# Patient Record
Sex: Female | Born: 1965 | Race: White | Hispanic: No | Marital: Married | Smoking: Former smoker
Health system: Southern US, Community
[De-identification: ages and names within clinical notes are randomized; demographics above are authoritative.]

## PROBLEM LIST (undated history)

## (undated) DIAGNOSIS — I82409 Acute embolism and thrombosis of unspecified deep veins of unspecified lower extremity: Secondary | ICD-10-CM

## (undated) HISTORY — DX: Acute embolism and thrombosis of unspecified deep veins of unspecified lower extremity: I82.409

---

## 1998-09-05 ENCOUNTER — Other Ambulatory Visit: Admission: RE | Admit: 1998-09-05 | Discharge: 1998-09-05 | Payer: Self-pay | Admitting: *Deleted

## 2000-05-10 ENCOUNTER — Other Ambulatory Visit: Admission: RE | Admit: 2000-05-10 | Discharge: 2000-05-10 | Payer: Self-pay | Admitting: *Deleted

## 2002-04-10 ENCOUNTER — Other Ambulatory Visit: Admission: RE | Admit: 2002-04-10 | Discharge: 2002-04-10 | Payer: Self-pay | Admitting: *Deleted

## 2002-07-19 HISTORY — PX: KNEE ARTHROSCOPY: SHX127

## 2003-05-23 ENCOUNTER — Other Ambulatory Visit: Admission: RE | Admit: 2003-05-23 | Discharge: 2003-05-23 | Payer: Self-pay | Admitting: *Deleted

## 2004-05-29 ENCOUNTER — Ambulatory Visit: Payer: Self-pay | Admitting: Internal Medicine

## 2004-07-28 ENCOUNTER — Ambulatory Visit: Payer: Self-pay | Admitting: Internal Medicine

## 2004-09-08 ENCOUNTER — Ambulatory Visit: Payer: Self-pay | Admitting: Internal Medicine

## 2004-10-07 ENCOUNTER — Ambulatory Visit: Payer: Self-pay | Admitting: Internal Medicine

## 2004-11-05 ENCOUNTER — Ambulatory Visit (HOSPITAL_COMMUNITY): Admission: RE | Admit: 2004-11-05 | Discharge: 2004-11-05 | Payer: Self-pay | Admitting: *Deleted

## 2005-01-11 ENCOUNTER — Ambulatory Visit (HOSPITAL_COMMUNITY): Admission: RE | Admit: 2005-01-11 | Discharge: 2005-01-11 | Payer: Self-pay | Admitting: Orthopedic Surgery

## 2005-01-12 ENCOUNTER — Ambulatory Visit: Payer: Self-pay | Admitting: Internal Medicine

## 2005-01-14 ENCOUNTER — Ambulatory Visit: Payer: Self-pay | Admitting: Internal Medicine

## 2005-01-15 ENCOUNTER — Ambulatory Visit: Payer: Self-pay | Admitting: Internal Medicine

## 2005-01-18 ENCOUNTER — Ambulatory Visit: Payer: Self-pay | Admitting: Internal Medicine

## 2005-01-20 ENCOUNTER — Ambulatory Visit: Payer: Self-pay | Admitting: Internal Medicine

## 2005-01-22 ENCOUNTER — Ambulatory Visit: Payer: Self-pay | Admitting: Internal Medicine

## 2005-01-25 ENCOUNTER — Ambulatory Visit: Payer: Self-pay | Admitting: Internal Medicine

## 2005-01-26 ENCOUNTER — Ambulatory Visit (HOSPITAL_COMMUNITY): Admission: RE | Admit: 2005-01-26 | Discharge: 2005-01-26 | Payer: Self-pay | Admitting: Neurosurgery

## 2005-02-02 ENCOUNTER — Ambulatory Visit: Payer: Self-pay | Admitting: Internal Medicine

## 2005-02-11 ENCOUNTER — Encounter: Admission: RE | Admit: 2005-02-11 | Discharge: 2005-02-23 | Payer: Self-pay | Admitting: Orthopedic Surgery

## 2005-02-15 ENCOUNTER — Ambulatory Visit: Payer: Self-pay | Admitting: Internal Medicine

## 2005-02-23 ENCOUNTER — Ambulatory Visit (HOSPITAL_COMMUNITY): Admission: RE | Admit: 2005-02-23 | Discharge: 2005-02-23 | Payer: Self-pay | Admitting: *Deleted

## 2005-02-25 ENCOUNTER — Ambulatory Visit: Payer: Self-pay | Admitting: Internal Medicine

## 2005-03-18 ENCOUNTER — Ambulatory Visit: Payer: Self-pay | Admitting: Internal Medicine

## 2005-03-25 ENCOUNTER — Ambulatory Visit: Payer: Self-pay | Admitting: Internal Medicine

## 2005-05-06 ENCOUNTER — Ambulatory Visit: Payer: Self-pay | Admitting: Internal Medicine

## 2005-06-30 ENCOUNTER — Ambulatory Visit: Payer: Self-pay | Admitting: Internal Medicine

## 2005-07-21 ENCOUNTER — Ambulatory Visit: Payer: Self-pay | Admitting: Internal Medicine

## 2005-08-04 ENCOUNTER — Ambulatory Visit: Payer: Self-pay | Admitting: Family Medicine

## 2005-09-15 ENCOUNTER — Ambulatory Visit: Payer: Self-pay

## 2005-12-17 ENCOUNTER — Ambulatory Visit: Payer: Self-pay | Admitting: Internal Medicine

## 2005-12-17 ENCOUNTER — Ambulatory Visit: Payer: Self-pay | Admitting: Cardiology

## 2006-01-17 ENCOUNTER — Ambulatory Visit: Payer: Self-pay | Admitting: Internal Medicine

## 2006-04-19 ENCOUNTER — Ambulatory Visit: Payer: Self-pay | Admitting: Internal Medicine

## 2007-05-08 ENCOUNTER — Telehealth: Payer: Self-pay | Admitting: *Deleted

## 2007-10-02 IMAGING — CT CT ANGIO CHEST
2 of 7 series · 19 of 46 positions shown · IV contrast (APPLIED)
Comparison: None.

CLINICAL DATA: Left-sided chest pain and shortness of breath for a month. Left upper quadrant pain radiating to left mid chest. History of deep venous thrombosis. Postop knee surgery one year ago.
CT ANGIOGRAPHY OF CHEST (PULMONARY EMBOLISM PROTOCOL):
TECHNIQUE: Multidetector CT imaging of the chest was performed according to the protocol for detection of pulmonary embolism during bolus injection of intravenous contrast.  Coronal and sagittal plane CT angiographic image reconstructions were also generated.
Contrast:  80 cc Omnipaque 300.

[Series 6: pe_xxl 1.0 b20f st · axial · 0.71mm/px · z∈[-290,-68]mm · 16 of 248 slices shown]
[im 13/248  lung]
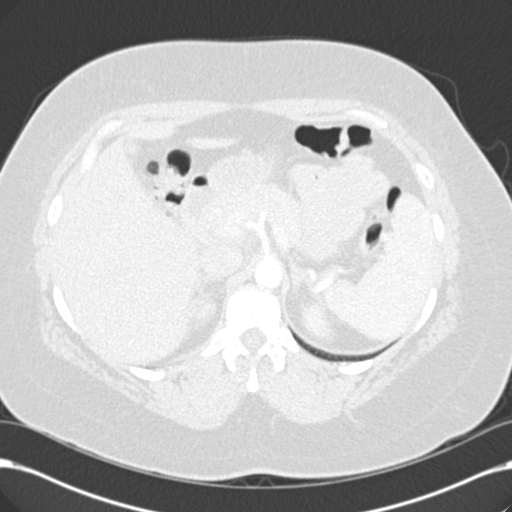
[im 25/248  soft-tissue]
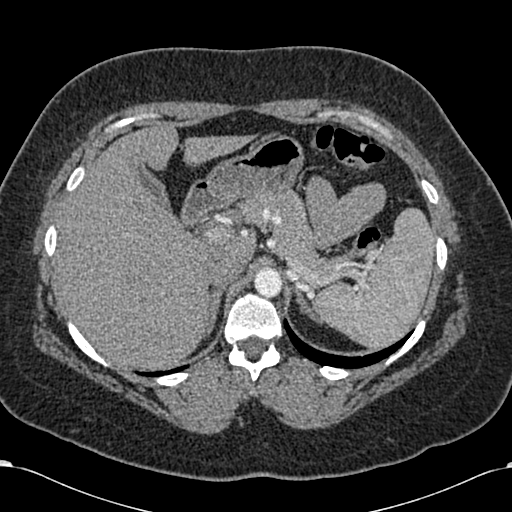
[im 38/248  lung]
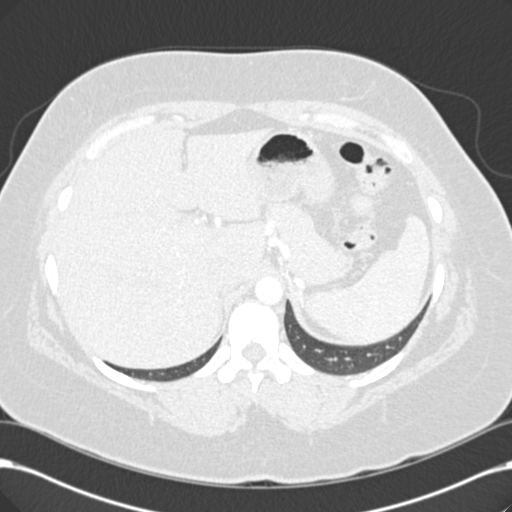
[im 62/248  soft-tissue]
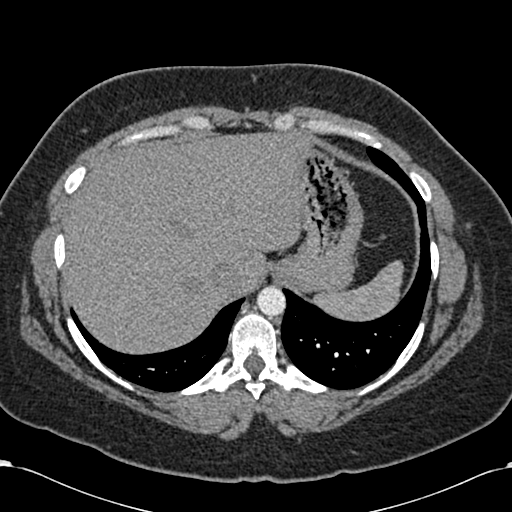
[im 75/248  lung]
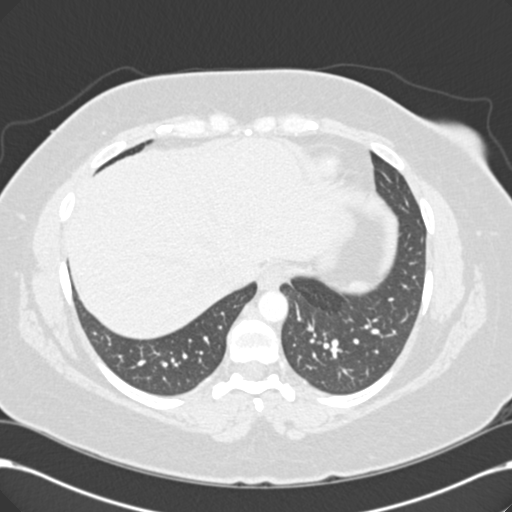
[im 87/248  soft-tissue]
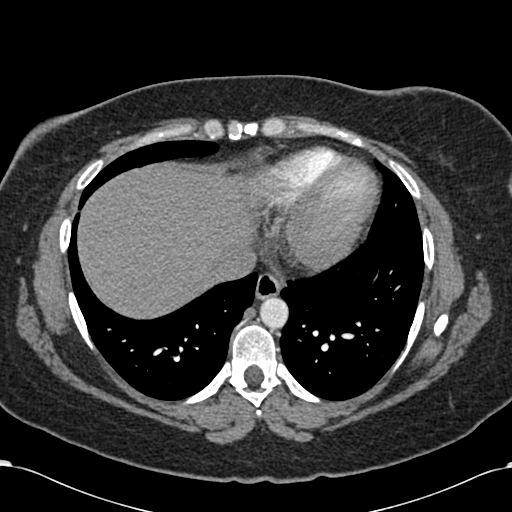
[im 99/248  lung]
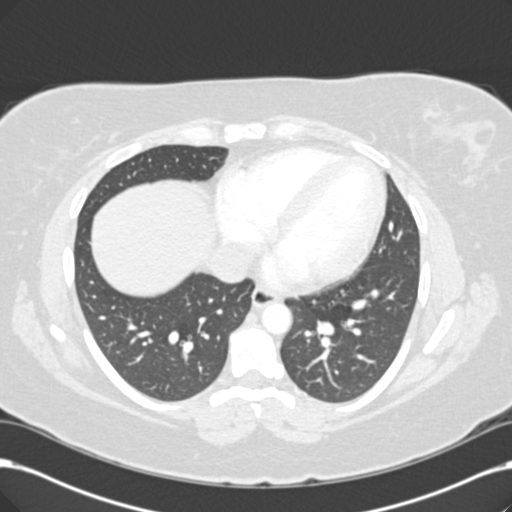
[im 112/248  soft-tissue]
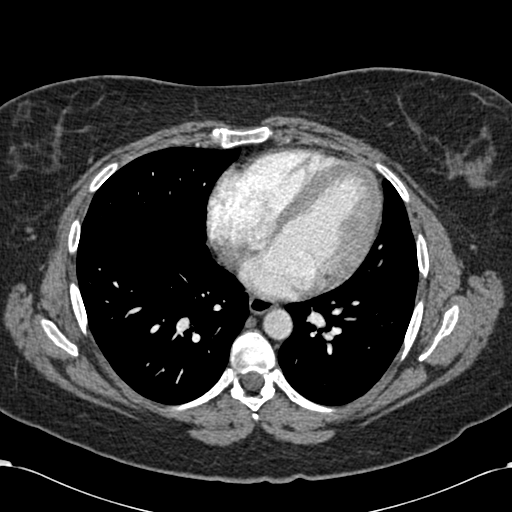
[im 136/248  lung]
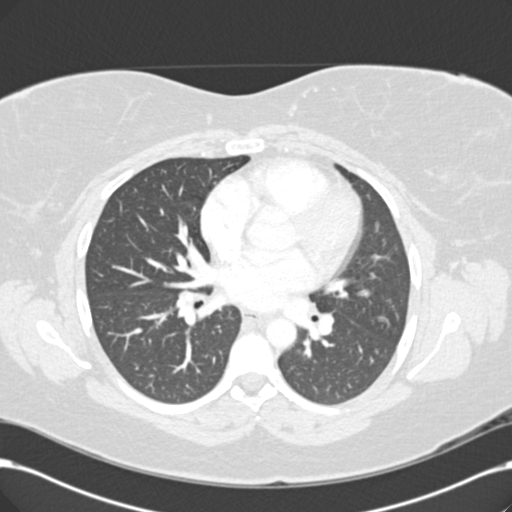
[im 149/248  soft-tissue]
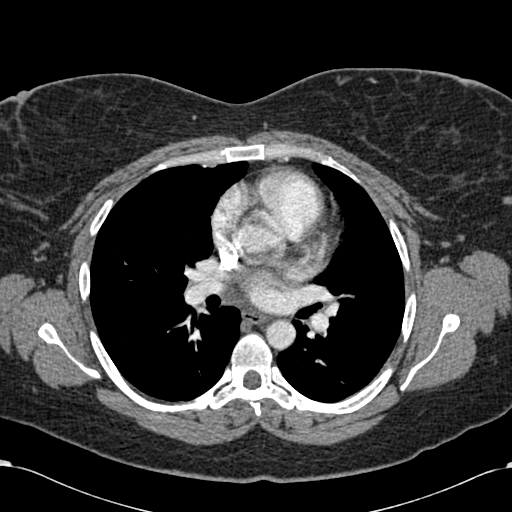
[im 161/248  lung]
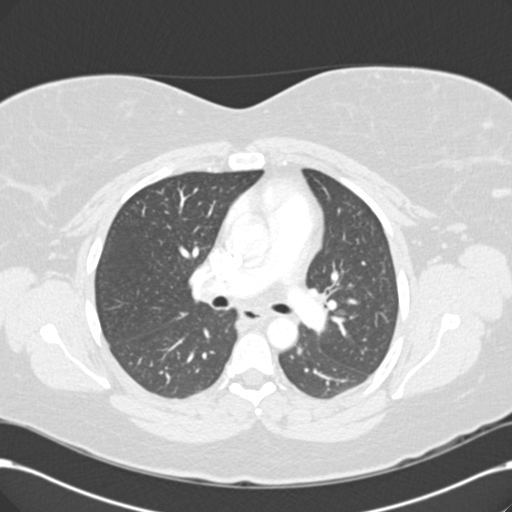
[im 173/248  soft-tissue]
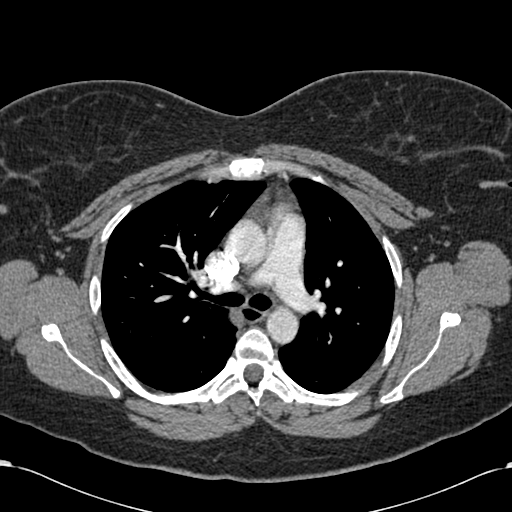
[im 186/248  lung]
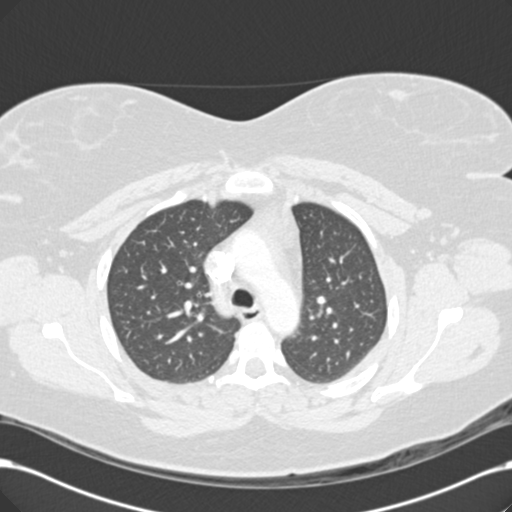
[im 210/248  soft-tissue]
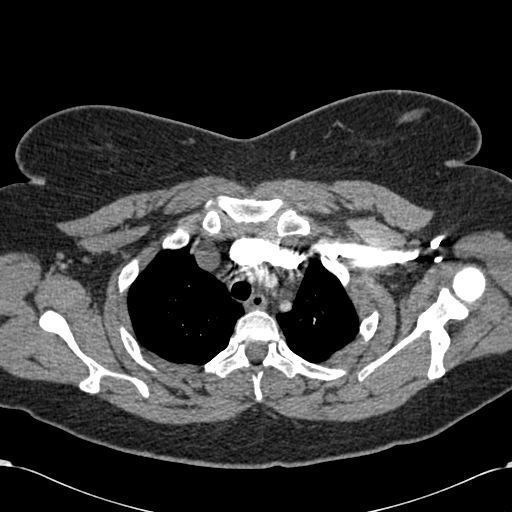
[im 223/248  lung]
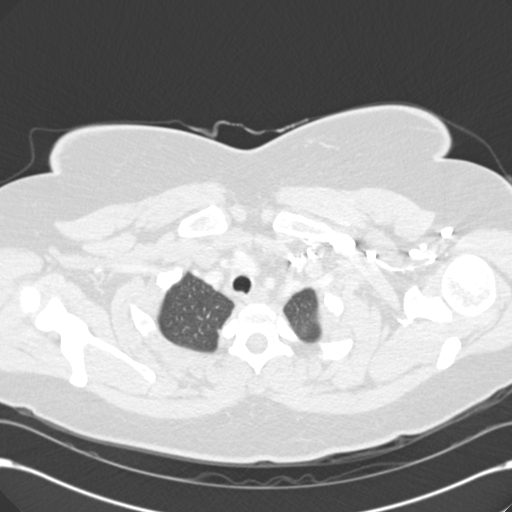
[im 235/248  soft-tissue]
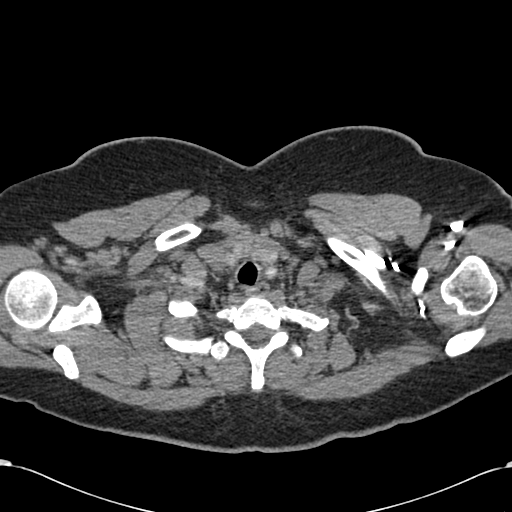

[Series 602: <mpr range> · coronal · 0.71mm/px · 3 of 39 slices shown]
[im 13/39  soft-tissue]
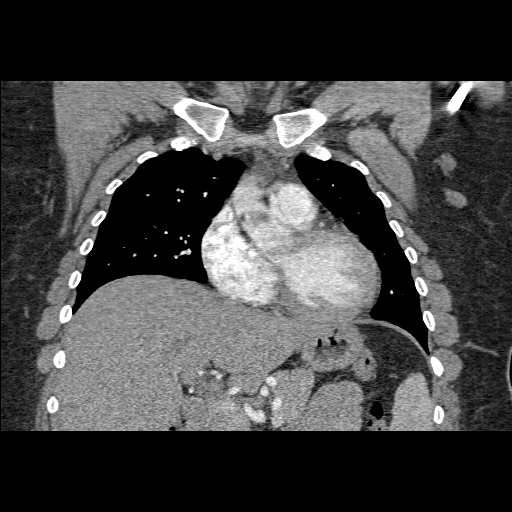
[im 17/39  soft-tissue]
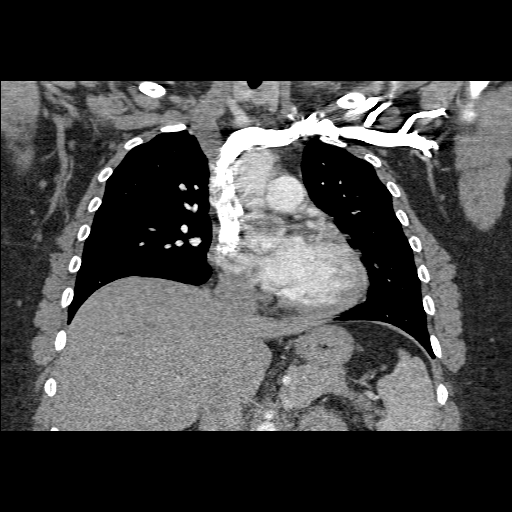
[im 22/39  soft-tissue]
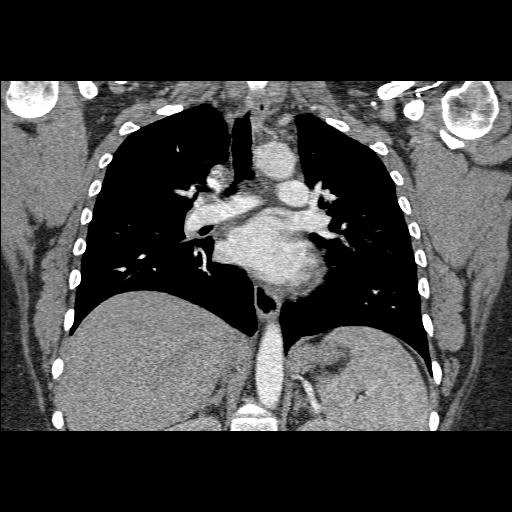

[19 of 46 positions shown; findings below may reference images not displayed]

FINDINGS: Lung windows demonstrate no nodules. No airspace disease.
Soft tissue windows: Quality of this exam for evaluation of pulmonary embolism is moderate. The main limitation is patient body habitus.  Mild motion artifact also limits evaluation.
Given this limitation, no filling defects in the pulmonary arterial tree to suggest pulmonary embolism.
The thoracic aorta is of normal caliber without evidence of dissection. Heart is at the upper limits of normal for size. There is no pericardial or pleural effusion. Minimal increased density in the anterior mediastinum likely relates to residual thymic tissue.  No mediastinal or hilar adenopathy.
Bone windows demonstrate no worrisome focal osseous lesion.
IMPRESSION: 1.  Limited exam as above due primarily to the patient?s size.  No evidence of central, lobar, or large segmental pulmonary embolus.
2.  No other explanation for patient?s symptoms.
3.  Borderline cardiomegaly.

## 2007-11-02 ENCOUNTER — Ambulatory Visit: Payer: Self-pay | Admitting: Internal Medicine

## 2007-11-02 ENCOUNTER — Telehealth: Payer: Self-pay | Admitting: Internal Medicine

## 2007-11-02 DIAGNOSIS — Z86718 Personal history of other venous thrombosis and embolism: Secondary | ICD-10-CM | POA: Insufficient documentation

## 2007-11-02 DIAGNOSIS — I10 Essential (primary) hypertension: Secondary | ICD-10-CM | POA: Insufficient documentation

## 2007-11-02 DIAGNOSIS — F329 Major depressive disorder, single episode, unspecified: Secondary | ICD-10-CM

## 2007-11-02 DIAGNOSIS — F3289 Other specified depressive episodes: Secondary | ICD-10-CM | POA: Insufficient documentation

## 2007-11-02 DIAGNOSIS — M1710 Unilateral primary osteoarthritis, unspecified knee: Secondary | ICD-10-CM | POA: Insufficient documentation

## 2009-02-03 ENCOUNTER — Ambulatory Visit: Payer: Self-pay | Admitting: Internal Medicine

## 2009-02-03 DIAGNOSIS — L02419 Cutaneous abscess of limb, unspecified: Secondary | ICD-10-CM | POA: Insufficient documentation

## 2009-02-03 DIAGNOSIS — T24309A Burn of third degree of unspecified site of unspecified lower limb, except ankle and foot, initial encounter: Secondary | ICD-10-CM | POA: Insufficient documentation

## 2009-02-03 DIAGNOSIS — L03119 Cellulitis of unspecified part of limb: Secondary | ICD-10-CM

## 2009-02-07 ENCOUNTER — Telehealth: Payer: Self-pay | Admitting: Internal Medicine

## 2009-02-14 ENCOUNTER — Ambulatory Visit: Payer: Self-pay | Admitting: Family Medicine

## 2009-04-11 ENCOUNTER — Ambulatory Visit: Payer: Self-pay | Admitting: Internal Medicine

## 2009-04-11 DIAGNOSIS — S81809A Unspecified open wound, unspecified lower leg, initial encounter: Secondary | ICD-10-CM

## 2009-04-11 DIAGNOSIS — S81009A Unspecified open wound, unspecified knee, initial encounter: Secondary | ICD-10-CM | POA: Insufficient documentation

## 2009-04-11 DIAGNOSIS — S91009A Unspecified open wound, unspecified ankle, initial encounter: Secondary | ICD-10-CM

## 2010-08-09 ENCOUNTER — Encounter: Payer: Self-pay | Admitting: *Deleted

## 2010-12-04 NOTE — Op Note (Signed)
Holly Burnett, CIENFUEGOS          ACCOUNT NO.:  [id number]   MEDICAL RECORD NO.:  [id number]          PATIENT TYPE:  AMB   LOCATION:  [facility]                           FACILITY:  [facility]   PHYSICIAN:  [place] B. Earlene Plater, M.D.  DATE OF BIRTH:  [birth year]   DATE OF PROCEDURE:  11/05/2004  DATE OF DISCHARGE:                                 OPERATIVE REPORT   PREOPERATIVE DIAGNOSIS:  Desires tubal sterilization.   POSTOPERATIVE DIAGNOSIS:  Desires tubal sterilization.   PROCEDURE:  Essure tubal sterilization.   SURGEON:  Chester Holstein. Earlene Plater, M.D.   ASSISTANT:  None.   ANESTHESIA:  MAC and 10 mL 1% Nesacaine paracervical block.   SPECIMENS:  None.   ESTIMATED BLOOD LOSS:  Minimal.   FLUID DEFICIT:  70 mL sorbitol.   FINDINGS:  A normal-appearing endometrial cavity, bilateral tubal ostia.  After placement, three coils visible on the left, five coils visible on the  right.   INDICATIONS:  The patient presents for a minimally-invasive tubal  sterilization.  The patient was advised of the 5 or 10% chance of inability  to cannulate one or both tubes.  Operative risks were discussed, including  infection, bleeding, uterine perforation, damage to surrounding organs, as  well as the need for HSG three months postop.   PROCEDURE:  The patient was taken to the operating room and MAC anesthesia  obtained.  She was placed in [place] stirrups, prepped and draped in standard  fashion, bladder emptied with a red rubber catheter.  A speculum inserted,  the single-tooth attached to the anterior lip of the cervix.  The Essure  scope was inserted with good distention.  Both tubes were visualized.  The  left was approached and the Essure inserted to the depth marker and released  in standard fashion.  Three coils were visible after placement.  On the  right the procedure was repeated, and afterwards five coils were visible.   The scope removed, single-tooth removed, cervix hemostatic.   The patient tolerated  the procedure well with no complications.  She was  taken to the recovery room awake, alert and in stable condition.      WBD/MEDQ  D:  11/05/2004  T:  11/05/2004  Job:  [id number]

## 2014-12-04 ENCOUNTER — Telehealth: Payer: Self-pay | Admitting: Internal Medicine

## 2014-12-04 NOTE — Telephone Encounter (Signed)
Ok Thx 

## 2014-12-04 NOTE — Telephone Encounter (Signed)
Holly DavidsoTillman Abiden,mother,called asking if plotnikov can take her 2nd daughter to be his pt due to high BP 190/90. Please help, she said Dr. Macario GoldsPlot told her before that he would take her daughter on as a pt.

## 2014-12-05 NOTE — Telephone Encounter (Signed)
appt set

## 2014-12-20 ENCOUNTER — Ambulatory Visit (INDEPENDENT_AMBULATORY_CARE_PROVIDER_SITE_OTHER): Payer: Self-pay | Admitting: Internal Medicine

## 2014-12-20 ENCOUNTER — Other Ambulatory Visit (INDEPENDENT_AMBULATORY_CARE_PROVIDER_SITE_OTHER): Payer: Self-pay

## 2014-12-20 ENCOUNTER — Encounter: Payer: Self-pay | Admitting: Internal Medicine

## 2014-12-20 VITALS — BP 188/120 | HR 88 | Ht 61.0 in | Wt 284.0 lb

## 2014-12-20 DIAGNOSIS — I10 Essential (primary) hypertension: Secondary | ICD-10-CM

## 2014-12-20 DIAGNOSIS — Z86718 Personal history of other venous thrombosis and embolism: Secondary | ICD-10-CM

## 2014-12-20 DIAGNOSIS — E669 Obesity, unspecified: Secondary | ICD-10-CM

## 2014-12-20 LAB — CBC WITH DIFFERENTIAL/PLATELET
BASOS PCT: 0.5 % (ref 0.0–3.0)
Basophils Absolute: 0 10*3/uL (ref 0.0–0.1)
Eosinophils Absolute: 0.1 10*3/uL (ref 0.0–0.7)
Eosinophils Relative: 1.3 % (ref 0.0–5.0)
HCT: 45.1 % (ref 36.0–46.0)
HEMOGLOBIN: 15.2 g/dL — AB (ref 12.0–15.0)
Lymphocytes Relative: 35.4 % (ref 12.0–46.0)
Lymphs Abs: 3.2 10*3/uL (ref 0.7–4.0)
MCHC: 33.7 g/dL (ref 30.0–36.0)
MCV: 85.4 fl (ref 78.0–100.0)
MONO ABS: 0.5 10*3/uL (ref 0.1–1.0)
MONOS PCT: 5.4 % (ref 3.0–12.0)
NEUTROS ABS: 5.2 10*3/uL (ref 1.4–7.7)
Neutrophils Relative %: 57.4 % (ref 43.0–77.0)
PLATELETS: 253 10*3/uL (ref 150.0–400.0)
RBC: 5.28 Mil/uL — ABNORMAL HIGH (ref 3.87–5.11)
RDW: 13.9 % (ref 11.5–15.5)
WBC: 9.1 10*3/uL (ref 4.0–10.5)

## 2014-12-20 LAB — BASIC METABOLIC PANEL
BUN: 13 mg/dL (ref 6–23)
CALCIUM: 9.2 mg/dL (ref 8.4–10.5)
CO2: 27 mEq/L (ref 19–32)
Chloride: 104 mEq/L (ref 96–112)
Creatinine, Ser: 0.87 mg/dL (ref 0.40–1.20)
GFR: 74.11 mL/min (ref >60.00)
GLUCOSE: 93 mg/dL (ref 70–99)
Potassium: 4.7 mEq/L (ref 3.5–5.1)
SODIUM: 137 meq/L (ref 135–145)

## 2014-12-20 LAB — TSH: TSH: 3.12 u[IU]/mL (ref 0.35–4.50)

## 2014-12-20 MED ORDER — LOSARTAN POTASSIUM-HCTZ 100-25 MG PO TABS: 1.0000 | ORAL_TABLET | Freq: Every day | ORAL | Status: DC

## 2014-12-20 MED ORDER — VITAMIN D 1000 UNITS PO TABS: 1000.0000 [IU] | ORAL_TABLET | Freq: Every day | ORAL | Status: AC

## 2014-12-20 NOTE — Progress Notes (Signed)
   Subjective:    Patient ID: Holly BleakKimberley W Wisehart, female    DOB: 04-26-1966, 49 y.o.   MRN: [id number]  HPI  New pt  Former pt of Dr Lovell SheehanJenkins  C/o HTN  Review of Systems     Objective:   Physical Exam        Assessment & Plan:

## 2014-12-20 NOTE — Progress Notes (Signed)
Pre visit review using our clinic review tool, if applicable. No additional management support is needed unless otherwise documented below in the visit note. 

## 2014-12-20 NOTE — Assessment & Plan Note (Signed)
Start Losartan HCT 

## 2014-12-20 NOTE — Assessment & Plan Note (Signed)
Labs  Low carb diet 

## 2014-12-20 NOTE — Patient Instructions (Signed)
Low carb diet  Hypertension Hypertension, commonly called high blood pressure, is when the force of blood pumping through your arteries is too strong. Your arteries are the blood vessels that carry blood from your heart throughout your body. A blood pressure reading consists of a higher number over a lower number, such as 110/72. The higher number (systolic) is the pressure inside your arteries when your heart pumps. The lower number (diastolic) is the pressure inside your arteries when your heart relaxes. Ideally you want your blood pressure below 120/80. Hypertension forces your heart to work harder to pump blood. Your arteries may become narrow or stiff. Having hypertension puts you at risk for heart disease, stroke, and other problems.  RISK FACTORS Some risk factors for high blood pressure are controllable. Others are not.  Risk factors you cannot control include:   Race. You may be at higher risk if you are African American.  Age. Risk increases with age.  Gender. Men are at higher risk than women before age 34 years. After age 74, women are at higher risk than men. Risk factors you can control include:  Not getting enough exercise or physical activity.  Being overweight.  Getting too much fat, sugar, calories, or salt in your diet.  Drinking too much alcohol. SIGNS AND SYMPTOMS Hypertension does not usually cause signs or symptoms. Extremely high blood pressure (hypertensive crisis) may cause headache, anxiety, shortness of breath, and nosebleed. DIAGNOSIS  To check if you have hypertension, your health care provider will measure your blood pressure while you are seated, with your arm held at the level of your heart. It should be measured at least twice using the same arm. Certain conditions can cause a difference in blood pressure between your right and left arms. A blood pressure reading that is higher than normal on one occasion does not mean that you need treatment. If one blood  pressure reading is high, ask your health care provider about having it checked again. TREATMENT  Treating high blood pressure includes making lifestyle changes and possibly taking medicine. Living a healthy lifestyle can help lower high blood pressure. You may need to change some of your habits. Lifestyle changes may include:  Following the DASH diet. This diet is high in fruits, vegetables, and whole grains. It is low in salt, red meat, and added sugars.  Getting at least 2 hours of brisk physical activity every week.  Losing weight if necessary.  Not smoking.  Limiting alcoholic beverages.  Learning ways to reduce stress. If lifestyle changes are not enough to get your blood pressure under control, your health care provider may prescribe medicine. You may need to take more than one. Work closely with your health care provider to understand the risks and benefits. HOME CARE INSTRUCTIONS  Have your blood pressure rechecked as directed by your health care provider.   Take medicines only as directed by your health care provider. Follow the directions carefully. Blood pressure medicines must be taken as prescribed. The medicine does not work as well when you skip doses. Skipping doses also puts you at risk for problems.   Do not smoke.   Monitor your blood pressure at home as directed by your health care provider. SEEK MEDICAL CARE IF:   You think you are having a reaction to medicines taken.  You have recurrent headaches or feel dizzy.  You have swelling in your ankles.  You have trouble with your vision. SEEK IMMEDIATE MEDICAL CARE IF:  You develop a  severe headache or confusion.  You have unusual weakness, numbness, or feel faint.  You have severe chest or abdominal pain.  You vomit repeatedly.  You have trouble breathing. MAKE SURE YOU:   Understand these instructions.  Will watch your condition.  Will get help right away if you are not doing well or get  worse. Document Released: 07/05/2005 Document Revised: 11/19/2013 Document Reviewed: 04/27/2013 [facility] Patient Information 2015 [place], [place]. This information is not intended to replace advice given to you by your health care provider. Make sure you discuss any questions you have with your health care provider.

## 2014-12-20 NOTE — Assessment & Plan Note (Signed)
LLE 2004 knee surgery and BCP related - residual swelling

## 2014-12-24 ENCOUNTER — Telehealth: Payer: Self-pay | Admitting: Internal Medicine

## 2014-12-24 NOTE — Telephone Encounter (Signed)
Please call patient. She returned your call °

## 2014-12-24 NOTE — Telephone Encounter (Signed)
Pt called back/informed of below- she states she has been told she snores. She has never been tested for OSA. Please advise  Notes Recorded by Merrilyn PumaStacey N Lauralynn Loeb, CMA on 12/23/2014 at 4:52 PM Left mess for patient to call back. Notes Recorded by Tresa GarterAleksei Plotnikov V, MD on 12/22/2014 at 6:23 PM Misty StanleyStacey, please, inform patient that all labs are normal except for elev Hgb: does she have OSA/snoring. Thx

## 2014-12-25 NOTE — Telephone Encounter (Signed)
Use 2 pillows. Sleep on the side. We should ref for testing when she gets [employer] insurance, I think. Let me know. Keep ROV Thx

## 2014-12-25 NOTE — Telephone Encounter (Signed)
Left detailed mess informing pt of below.  

## 2015-01-31 ENCOUNTER — Ambulatory Visit: Payer: Self-pay | Admitting: Internal Medicine

## 2016-02-03 ENCOUNTER — Other Ambulatory Visit: Payer: Self-pay | Admitting: Internal Medicine
